# Patient Record
Sex: Female | Born: 1960 | Race: White | Hispanic: No | Smoking: Never smoker
Health system: Southern US, Community
[De-identification: ages and names within clinical notes are randomized; demographics above are authoritative.]

## PROBLEM LIST (undated history)

## (undated) DIAGNOSIS — E079 Disorder of thyroid, unspecified: Secondary | ICD-10-CM

## (undated) DIAGNOSIS — N719 Inflammatory disease of uterus, unspecified: Secondary | ICD-10-CM

## (undated) DIAGNOSIS — M1990 Unspecified osteoarthritis, unspecified site: Secondary | ICD-10-CM

## (undated) DIAGNOSIS — I10 Essential (primary) hypertension: Secondary | ICD-10-CM

## (undated) DIAGNOSIS — J45909 Unspecified asthma, uncomplicated: Secondary | ICD-10-CM

## (undated) HISTORY — DX: Unspecified osteoarthritis, unspecified site: M19.90

## (undated) HISTORY — DX: Unspecified asthma, uncomplicated: J45.909

## (undated) HISTORY — DX: Inflammatory disease of uterus, unspecified: N71.9

## (undated) HISTORY — DX: Disorder of thyroid, unspecified: E07.9

## (undated) HISTORY — DX: Essential (primary) hypertension: I10

---

## 2013-06-18 ENCOUNTER — Other Ambulatory Visit: Payer: Self-pay | Admitting: Obstetrics and Gynecology

## 2013-06-18 DIAGNOSIS — Z1231 Encounter for screening mammogram for malignant neoplasm of breast: Secondary | ICD-10-CM

## 2013-06-30 ENCOUNTER — Ambulatory Visit (INDEPENDENT_AMBULATORY_CARE_PROVIDER_SITE_OTHER): Payer: PRIVATE HEALTH INSURANCE

## 2013-06-30 DIAGNOSIS — R928 Other abnormal and inconclusive findings on diagnostic imaging of breast: Secondary | ICD-10-CM

## 2013-06-30 DIAGNOSIS — Z1231 Encounter for screening mammogram for malignant neoplasm of breast: Secondary | ICD-10-CM

## 2013-07-03 ENCOUNTER — Other Ambulatory Visit: Payer: Self-pay | Admitting: Obstetrics and Gynecology

## 2013-07-03 DIAGNOSIS — R928 Other abnormal and inconclusive findings on diagnostic imaging of breast: Secondary | ICD-10-CM

## 2013-07-23 ENCOUNTER — Other Ambulatory Visit: Payer: PRIVATE HEALTH INSURANCE

## 2013-08-05 ENCOUNTER — Ambulatory Visit
Admission: RE | Admit: 2013-08-05 | Discharge: 2013-08-05 | Disposition: A | Discharge status: Home / Self Care | Payer: PRIVATE HEALTH INSURANCE | Source: Ambulatory Visit | Attending: Obstetrics and Gynecology | Admitting: Obstetrics and Gynecology

## 2013-08-05 DIAGNOSIS — R928 Other abnormal and inconclusive findings on diagnostic imaging of breast: Secondary | ICD-10-CM

## 2014-07-27 DIAGNOSIS — Z872 Personal history of diseases of the skin and subcutaneous tissue: Secondary | ICD-10-CM | POA: Insufficient documentation

## 2017-01-02 LAB — HM PAP SMEAR: HM Pap smear: NEGATIVE

## 2017-04-11 LAB — HM MAMMOGRAPHY: HM Mammogram: NORMAL (ref 0–4)

## 2020-02-05 ENCOUNTER — Other Ambulatory Visit: Payer: Self-pay

## 2020-02-05 ENCOUNTER — Encounter: Payer: Self-pay | Admitting: Family Medicine

## 2020-02-05 ENCOUNTER — Ambulatory Visit: Payer: PRIVATE HEALTH INSURANCE | Admitting: Family Medicine

## 2020-02-05 VITALS — BP 143/79 | HR 56 | Temp 97.7°F | Ht 63.5 in | Wt 195.0 lb

## 2020-02-05 DIAGNOSIS — N952 Postmenopausal atrophic vaginitis: Secondary | ICD-10-CM | POA: Diagnosis not present

## 2020-02-05 DIAGNOSIS — J45909 Unspecified asthma, uncomplicated: Secondary | ICD-10-CM | POA: Insufficient documentation

## 2020-02-05 DIAGNOSIS — K589 Irritable bowel syndrome without diarrhea: Secondary | ICD-10-CM | POA: Insufficient documentation

## 2020-02-05 DIAGNOSIS — E039 Hypothyroidism, unspecified: Secondary | ICD-10-CM | POA: Diagnosis not present

## 2020-02-05 DIAGNOSIS — J4540 Moderate persistent asthma, uncomplicated: Secondary | ICD-10-CM | POA: Diagnosis not present

## 2020-02-05 DIAGNOSIS — I10 Essential (primary) hypertension: Secondary | ICD-10-CM

## 2020-02-05 DIAGNOSIS — Z780 Asymptomatic menopausal state: Secondary | ICD-10-CM

## 2020-02-05 DIAGNOSIS — Z0000 Encounter for general adult medical examination without abnormal findings: Secondary | ICD-10-CM

## 2020-02-05 MED ORDER — METOPROLOL TARTRATE 25 MG PO TABS: 25.0000 mg | ORAL_TABLET | Freq: Two times a day (BID) | ORAL | 1 refills | Status: DC

## 2020-02-05 MED ORDER — FLUTICASONE-SALMETEROL 250-50 MCG/DOSE IN AEPB: 1.0000 | INHALATION_SPRAY | Freq: Every day | RESPIRATORY_TRACT | 5 refills | Status: AC | PRN

## 2020-02-05 MED ORDER — ESTROGENS, CONJUGATED 0.625 MG/GM VA CREA: 1.0000 | TOPICAL_CREAM | Freq: Every day | VAGINAL | 2 refills | Status: DC

## 2020-02-05 MED ORDER — ALBUTEROL SULFATE HFA 108 (90 BASE) MCG/ACT IN AERS: 2.0000 | INHALATION_SPRAY | Freq: Four times a day (QID) | RESPIRATORY_TRACT | 5 refills | Status: AC | PRN

## 2020-02-05 NOTE — Patient Instructions (Signed)
Schedule pap smear and mammogram here.  Call [facility] to schedule colonoscopy.

## 2020-02-05 NOTE — Progress Notes (Signed)
New Patient Office Visit  Assessment & Plan:  1. Moderate persistent asthma without complication - Well controlled on current regimen.  - Fluticasone-Salmeterol (ADVAIR) 250-50 MCG/DOSE AEPB; Inhale 1 puff into the lungs daily as needed.  Dispense: 60 each; Refill: 5 - albuterol (PROAIR HFA) 108 (90 Base) MCG/ACT inhaler; Inhale 2 puffs into the lungs every 6 (six) hours as needed for wheezing or shortness of breath.  Dispense: 18 g; Refill: 5 - Cholecalciferol (VITAMIN D3) 250 MCG (10000 UT) capsule; Take 20,000 Units by mouth daily.  2. Hypothyroidism, unspecified type - TSH  3. Essential hypertension - Well controlled on current regimen.  - metoprolol tartrate (LOPRESSOR) 25 MG tablet; Take 1 tablet (25 mg total) by mouth 2 (two) times daily.  Dispense: 180 tablet; Refill: 1 - CBC with Differential/Platelet - CMP14+EGFR - Lipid panel  4-5. Vaginal atrophy/Postmenopausal - Patient agreeable to creams as long as she can afford them.  Discussed risk associated with estrogen therapy. - conjugated estrogens (PREMARIN) vaginal cream; Place 1 Applicatorful vaginally daily. x3 weeks. Take a week off and repeat.  Dispense: 60 g; Refill: 2  6.  Healthcare maintenance - Patient is going to schedule a physical with Pap smear to be completed here.  She is going to schedule to complete her mammogram on the bus here.  She is going to call [facility] in [place] to schedule her colonoscopy.   Follow-up: Return for annual physical w. pap smear.   Hendricks Limes, MSN, APRN, FNP-C [facility]  Subjective:  Patient ID: Hayley Murillo, female    DOB: 05/12/1961  Age: 59 y.o. MRN: [id number]  Patient Care Team: Loman Brooklyn, FNP as PCP - General (Family Medicine)  CC:  Chief Complaint  Patient presents with  . New Patient (Initial Visit)    Suzanna Obey, MD  . Establish Care    HPI Hayley Murillo presents to establish care.   Asthma: Patient reports she  rarely has to use albuterol.  She only uses the Advair seasonally.  She does take vitamin D3 20,000 units to help control her asthma and has been doing this since she read a research study proving its benefits.  She reports a history of hypothyroidism, but does not take medication for this.  Also has a history of IBS for which she does not take medication.  Patient is postmenopausal and experiences dyspareunia.  She reports she was prescribed creams in the past but they were expensive so she could not try them.   Review of Systems  Constitutional: Negative for chills, fever, malaise/fatigue and weight loss.  HENT: Negative for congestion, ear discharge, ear pain, nosebleeds, sinus pain, sore throat and tinnitus.   Eyes: Negative for blurred vision, double vision, pain, discharge and redness.  Respiratory: Negative for cough, shortness of breath and wheezing.   Cardiovascular: Negative for chest pain, palpitations and leg swelling.  Gastrointestinal: Negative for abdominal pain, constipation, diarrhea, heartburn, nausea and vomiting.  Genitourinary: Negative for dysuria, frequency and urgency.  Musculoskeletal: Negative for myalgias.  Skin: Negative for rash.  Neurological: Negative for dizziness, seizures, weakness and headaches.  Psychiatric/Behavioral: Negative for depression, substance abuse and suicidal ideas. The patient is not nervous/anxious.     Current Outpatient Medications:  .  albuterol (PROAIR HFA) 108 (90 Base) MCG/ACT inhaler, Inhale 2 puffs into the lungs every 6 (six) hours as needed for wheezing or shortness of breath., Disp: , Rfl:  .  Fluticasone-Salmeterol (ADVAIR) 250-50 MCG/DOSE AEPB, Inhale 1 puff into  the lungs daily as needed., Disp: , Rfl:  .  metoprolol tartrate (LOPRESSOR) 25 MG tablet, Take 25 mg by mouth 2 (two) times daily., Disp: , Rfl:   Allergies  Allergen Reactions  . Latex Itching    Cough, itching, sneezing   . Morphine And Related Itching  .  Dust Mite Extract Itching  . Lactose Nausea And Vomiting    Pt states it causes her to have IBS occassionally but not all the time. She still consumes it.   . Other Itching    Pt states she sneezes, has eye itching, and respiratory issues with dust, mold, and pollen. "nothing life threatening" per pt.   . Wheat Bran Itching    Pt still consumes wheat but just notes some intolerance  . Lisinopril Cough    significant    Past Medical History:  Diagnosis Date  . Arthritis   . Asthma   . Endometritis   . Hypertension   . Thyroid disease     Past Surgical History:  Procedure Laterality Date  . CESAREAN SECTION  1984  . CESAREAN SECTION  1991    Family History  Problem Relation Age of Onset  . Colon cancer Father   . Asthma Sister   . Asthma Daughter   . Colon cancer Paternal Aunt   . Hypertension Paternal Grandmother   . Colon cancer Paternal Grandfather   . Bone cancer Paternal Grandfather     Social History   Socioeconomic History  . Marital status: Widowed    Spouse name: Not on file  . Number of children: Not on file  . Years of education: Not on file  . Highest education level: Not on file  Occupational History  . Not on file  Tobacco Use  . Smoking status: Never Smoker  . Smokeless tobacco: Never Used  Vaping Use  . Vaping Use: Never used  Substance and Sexual Activity  . Alcohol use: Yes    Comment: occ  . Drug use: Never  . Sexual activity: Yes    Birth control/protection: None  Other Topics Concern  . Not on file  Social History Narrative  . Not on file   Social Determinants of Health   Financial Resource Strain:   . Difficulty of Paying Living Expenses:   Food Insecurity:   . Worried About Charity fundraiser in the Last Year:   . Arboriculturist in the Last Year:   Transportation Needs:   . Film/video editor (Medical):   Marland Kitchen Lack of Transportation (Non-Medical):   Physical Activity:   . Days of Exercise per Week:   . Minutes of  Exercise per Session:   Stress:   . Feeling of Stress :   Social Connections:   . Frequency of Communication with Friends and Family:   . Frequency of Social Gatherings with Friends and Family:   . Attends Religious Services:   . Active Member of Clubs or Organizations:   . Attends Archivist Meetings:   Marland Kitchen Marital Status:   Intimate Partner Violence:   . Fear of Current or Ex-Partner:   . Emotionally Abused:   Marland Kitchen Physically Abused:   . Sexually Abused:     Objective:   Today's Vitals: BP (!) 143/79   Pulse (!) 56   Temp 97.7 F (36.5 C) (Temporal)   Ht 5' 3.5" (1.613 m)   Wt 195 lb (88.5 kg)   SpO2 97%   BMI 34.00 kg/m  Physical Exam Vitals reviewed.  Constitutional:      General: She is not in acute distress.    Appearance: Normal appearance. She is obese. She is not ill-appearing, toxic-appearing or diaphoretic.  HENT:     Head: Normocephalic and atraumatic.  Eyes:     General: No scleral icterus.       Right eye: No discharge.        Left eye: No discharge.     Conjunctiva/sclera: Conjunctivae normal.  Cardiovascular:     Rate and Rhythm: Normal rate and regular rhythm.     Heart sounds: Normal heart sounds. No murmur heard.  No friction rub. No gallop.   Pulmonary:     Effort: Pulmonary effort is normal. No respiratory distress.     Breath sounds: Normal breath sounds. No stridor. No wheezing, rhonchi or rales.  Musculoskeletal:        General: Normal range of motion.     Cervical back: Normal range of motion.  Skin:    General: Skin is warm and dry.     Capillary Refill: Capillary refill takes less than 2 seconds.  Neurological:     General: No focal deficit present.     Mental Status: She is alert and oriented to person, place, and time. Mental status is at baseline.  Psychiatric:        Mood and Affect: Mood normal.        Behavior: Behavior normal.        Thought Content: Thought content normal.        Judgment: Judgment normal.

## 2020-02-06 LAB — CBC WITH DIFFERENTIAL/PLATELET
Basophils Absolute: 0.1 10*3/uL (ref 0.0–0.2)
Basos: 1 %
EOS (ABSOLUTE): 0.5 10*3/uL — ABNORMAL HIGH (ref 0.0–0.4)
Eos: 4 %
Hematocrit: 42.3 % (ref 34.0–46.6)
Hemoglobin: 14.4 g/dL (ref 11.1–15.9)
Immature Grans (Abs): 0 10*3/uL (ref 0.0–0.1)
Immature Granulocytes: 0 %
Lymphocytes Absolute: 6.1 10*3/uL — ABNORMAL HIGH (ref 0.7–3.1)
Lymphs: 49 %
MCH: 30 pg (ref 26.6–33.0)
MCHC: 34 g/dL (ref 31.5–35.7)
MCV: 88 fL (ref 79–97)
Monocytes Absolute: 1.2 10*3/uL — ABNORMAL HIGH (ref 0.1–0.9)
Monocytes: 9 %
Neutrophils Absolute: 4.7 10*3/uL (ref 1.4–7.0)
Neutrophils: 37 %
Platelets: 305 10*3/uL (ref 150–450)
RBC: 4.8 x10E6/uL (ref 3.77–5.28)
RDW: 14.3 % (ref 11.7–15.4)
WBC: 12.5 10*3/uL — ABNORMAL HIGH (ref 3.4–10.8)

## 2020-02-06 LAB — TSH: TSH: 8.31 u[IU]/mL — ABNORMAL HIGH (ref 0.450–4.500)

## 2020-02-06 LAB — CMP14+EGFR
ALT: 23 IU/L (ref 0–32)
AST: 24 IU/L (ref 0–40)
Albumin/Globulin Ratio: 1.8 (ref 1.2–2.2)
Albumin: 4.6 g/dL (ref 3.8–4.9)
Alkaline Phosphatase: 137 IU/L — ABNORMAL HIGH (ref 48–121)
BUN/Creatinine Ratio: 21 (ref 9–23)
BUN: 20 mg/dL (ref 6–24)
Bilirubin Total: <0.2 mg/dL (ref 0.0–1.2)
CO2: 22 mmol/L (ref 20–29)
Calcium: 9.6 mg/dL (ref 8.7–10.2)
Chloride: 104 mmol/L (ref 96–106)
Creatinine, Ser: 0.94 mg/dL (ref 0.57–1.00)
GFR calc Af Amer: 77 mL/min/{1.73_m2} (ref >59)
GFR calc non Af Amer: 67 mL/min/{1.73_m2} (ref >59)
Globulin, Total: 2.5 g/dL (ref 1.5–4.5)
Glucose: 86 mg/dL (ref 65–99)
Potassium: 4.2 mmol/L (ref 3.5–5.2)
Sodium: 142 mmol/L (ref 134–144)
Total Protein: 7.1 g/dL (ref 6.0–8.5)

## 2020-02-06 LAB — LIPID PANEL
Chol/HDL Ratio: 4.2 ratio (ref 0.0–4.4)
Cholesterol, Total: 245 mg/dL — ABNORMAL HIGH (ref 100–199)
HDL: 59 mg/dL (ref >39)
LDL Chol Calc (NIH): 153 mg/dL — ABNORMAL HIGH (ref 0–99)
Triglycerides: 184 mg/dL — ABNORMAL HIGH (ref 0–149)
VLDL Cholesterol Cal: 33 mg/dL (ref 5–40)

## 2020-02-07 DIAGNOSIS — I10 Essential (primary) hypertension: Secondary | ICD-10-CM | POA: Insufficient documentation

## 2020-02-07 DIAGNOSIS — Z780 Asymptomatic menopausal state: Secondary | ICD-10-CM | POA: Insufficient documentation

## 2020-02-07 DIAGNOSIS — N952 Postmenopausal atrophic vaginitis: Secondary | ICD-10-CM | POA: Insufficient documentation

## 2020-02-08 ENCOUNTER — Encounter: Payer: Self-pay | Admitting: Family Medicine

## 2020-02-08 DIAGNOSIS — E782 Mixed hyperlipidemia: Secondary | ICD-10-CM | POA: Insufficient documentation

## 2020-02-08 HISTORY — DX: Mixed hyperlipidemia: E78.2

## 2020-02-09 NOTE — Progress Notes (Signed)
done

## 2020-02-18 ENCOUNTER — Other Ambulatory Visit: Payer: Self-pay | Admitting: Family Medicine

## 2020-02-18 ENCOUNTER — Other Ambulatory Visit: Payer: Self-pay | Admitting: *Deleted

## 2020-02-18 ENCOUNTER — Telehealth: Payer: Self-pay | Admitting: Family Medicine

## 2020-02-19 ENCOUNTER — Other Ambulatory Visit: Payer: Self-pay | Admitting: Family Medicine

## 2020-02-19 DIAGNOSIS — Z1231 Encounter for screening mammogram for malignant neoplasm of breast: Secondary | ICD-10-CM

## 2020-03-03 ENCOUNTER — Other Ambulatory Visit: Payer: Self-pay

## 2020-03-18 ENCOUNTER — Encounter: Payer: PRIVATE HEALTH INSURANCE | Admitting: Family Medicine

## 2020-03-30 ENCOUNTER — Other Ambulatory Visit: Payer: Self-pay

## 2020-03-30 ENCOUNTER — Ambulatory Visit
Admission: RE | Admit: 2020-03-30 | Discharge: 2020-03-30 | Disposition: A | Discharge status: Home / Self Care | Payer: PRIVATE HEALTH INSURANCE | Source: Ambulatory Visit | Attending: Family Medicine | Admitting: Family Medicine

## 2020-03-30 DIAGNOSIS — Z1231 Encounter for screening mammogram for malignant neoplasm of breast: Secondary | ICD-10-CM

## 2020-04-15 ENCOUNTER — Encounter: Payer: PRIVATE HEALTH INSURANCE | Admitting: Family Medicine

## 2020-04-22 ENCOUNTER — Ambulatory Visit (INDEPENDENT_AMBULATORY_CARE_PROVIDER_SITE_OTHER): Payer: PRIVATE HEALTH INSURANCE | Admitting: Family Medicine

## 2020-04-22 ENCOUNTER — Other Ambulatory Visit: Payer: Self-pay

## 2020-04-22 ENCOUNTER — Other Ambulatory Visit (HOSPITAL_COMMUNITY)
Admission: RE | Admit: 2020-04-22 | Discharge: 2020-04-22 | Disposition: A | Discharge status: Home / Self Care | Payer: PRIVATE HEALTH INSURANCE | Source: Ambulatory Visit | Attending: Family Medicine | Admitting: Family Medicine

## 2020-04-22 ENCOUNTER — Encounter: Payer: Self-pay | Admitting: Family Medicine

## 2020-04-22 VITALS — BP 168/82 | HR 55 | Temp 97.8°F | Resp 20 | Ht 63.0 in | Wt 191.0 lb

## 2020-04-22 DIAGNOSIS — Z124 Encounter for screening for malignant neoplasm of cervix: Secondary | ICD-10-CM | POA: Diagnosis not present

## 2020-04-22 NOTE — Patient Instructions (Signed)

## 2020-04-22 NOTE — Progress Notes (Signed)
Subjective: CC: pap smear PCP: Loman Brooklyn, FNP  Hayley Murillo is a 59 y.o. female presenting to clinic today for:  1. Cervical cancer screening Hayley Murillo is here for a pap smear. Her last pap smear was in 2018 and was normal. She reports vaginal dryness for which she had tried Premarin for, but decided to stop due to the cost. She is not currently interested in other treatments. She denies vaginal discharge, bleeding, itching, or pain. She does want a physical today and would just like to have a pap done.   Relevant past medical, surgical, family, and social history reviewed and updated as indicated.  Allergies and medications reviewed and updated.  Allergies  Allergen Reactions  . Latex Itching    Cough, itching, sneezing   . Morphine And Related Itching  . Dust Mite Extract Itching  . Lactose Nausea And Vomiting    Pt states it causes her to have IBS occassionally but not all the time. She still consumes it.   . Other Itching    Pt states she sneezes, has eye itching, and respiratory issues with dust, mold, and pollen. "nothing life threatening" per pt.   . Wheat Bran Itching    Pt still consumes wheat but just notes some intolerance  . Lisinopril Cough    significant   Past Medical History:  Diagnosis Date  . Arthritis   . Asthma   . Endometritis   . Hypertension   . Mixed hyperlipidemia 02/08/2020  . Thyroid disease     Current Outpatient Medications:  .  albuterol (PROAIR HFA) 108 (90 Base) MCG/ACT inhaler, Inhale 2 puffs into the lungs every 6 (six) hours as needed for wheezing or shortness of breath., Disp: 18 g, Rfl: 5 .  Cholecalciferol (VITAMIN D3) 250 MCG (10000 UT) capsule, Take 20,000 Units by mouth daily., Disp: , Rfl:  .  Fluticasone-Salmeterol (ADVAIR) 250-50 MCG/DOSE AEPB, Inhale 1 puff into the lungs daily as needed., Disp: 60 each, Rfl: 5 .  metoprolol tartrate (LOPRESSOR) 25 MG tablet, Take 1 tablet (25 mg total) by mouth 2 (two) times daily.,  Disp: 180 tablet, Rfl: 1 Social History   Socioeconomic History  . Marital status: Widowed    Spouse name: Not on file  . Number of children: Not on file  . Years of education: Not on file  . Highest education level: Not on file  Occupational History  . Not on file  Tobacco Use  . Smoking status: Never Smoker  . Smokeless tobacco: Never Used  Vaping Use  . Vaping Use: Never used  Substance and Sexual Activity  . Alcohol use: Yes    Comment: occ  . Drug use: Never  . Sexual activity: Yes    Birth control/protection: None  Other Topics Concern  . Not on file  Social History Narrative  . Not on file   Social Determinants of Health   Financial Resource Strain:   . Difficulty of Paying Living Expenses: Not on file  Food Insecurity:   . Worried About Charity fundraiser in the Last Year: Not on file  . Ran Out of Food in the Last Year: Not on file  Transportation Needs:   . Lack of Transportation (Medical): Not on file  . Lack of Transportation (Non-Medical): Not on file  Physical Activity:   . Days of Exercise per Week: Not on file  . Minutes of Exercise per Session: Not on file  Stress:   . Feeling of Stress :  Not on file  Social Connections:   . Frequency of Communication with Friends and Family: Not on file  . Frequency of Social Gatherings with Friends and Family: Not on file  . Attends Religious Services: Not on file  . Active Member of Clubs or Organizations: Not on file  . Attends Archivist Meetings: Not on file  . Marital Status: Not on file  Intimate Partner Violence:   . Fear of Current or Ex-Partner: Not on file  . Emotionally Abused: Not on file  . Physically Abused: Not on file  . Sexually Abused: Not on file   Family History  Problem Relation Age of Onset  . Colon cancer Father   . Asthma Sister   . Asthma Daughter   . Colon cancer Paternal Aunt   . Hypertension Paternal Grandmother   . Colon cancer Paternal Grandfather   . Bone  cancer Paternal Grandfather     Review of Systems  Constitutional: Negative for chills, fever and unexpected weight change.  Genitourinary: Negative for decreased urine volume, difficulty urinating, genital sores, pelvic pain, urgency, vaginal bleeding, vaginal discharge and vaginal pain.     Objective: Office vital signs reviewed. BP (!) 168/82   Pulse (!) 55   Temp 97.8 F (36.6 C) (Temporal)   Resp 20   Ht 5' 3"  (1.6 m)   Wt 191 lb (86.6 kg)   SpO2 99%   BMI 33.83 kg/m   Physical Examination:  Physical Exam Vitals and nursing note reviewed. Exam conducted with a chaperone present.  Constitutional:      General: She is not in acute distress.    Appearance: Normal appearance. She is not ill-appearing.  Cardiovascular:     Rate and Rhythm: Normal rate and regular rhythm.     Pulses: Normal pulses.     Heart sounds: Normal heart sounds.  Pulmonary:     Effort: Pulmonary effort is normal.     Breath sounds: Normal breath sounds.  Abdominal:     Palpations: Abdomen is soft.  Genitourinary:    General: Normal vulva.     Pubic Area: No rash or pubic lice.      Labia:        Right: No rash, tenderness, lesion or injury.        Left: No rash, tenderness, lesion or injury.      Vagina: Normal.     Cervix: Normal.     Uterus: Normal.      Adnexa: Right adnexa normal and left adnexa normal.  Neurological:     Mental Status: She is alert and oriented to person, place, and time.  Psychiatric:        Mood and Affect: Mood normal.        Behavior: Behavior normal.      Results for orders placed or performed in visit on 02/05/20  HM MAMMOGRAPHY  Result Value Ref Range   HM Mammogram Self Reported Normal 0-4 Bi-Rad, Self Reported Normal  CBC with Differential/Platelet  Result Value Ref Range   WBC 12.5 (H) 3.4 - 10.8 x10E3/uL   RBC 4.80 3.77 - 5.28 x10E6/uL   Hemoglobin 14.4 11.1 - 15.9 g/dL   Hematocrit 42.3 34.0 - 46.6 %   MCV 88 79 - 97 fL   MCH 30.0 26.6 - 33.0 pg    MCHC 34.0 31 - 35 g/dL   RDW 14.3 11.7 - 15.4 %   Platelets 305 150 - 450 x10E3/uL   Neutrophils 37 Not Estab. %  Lymphs 49 Not Estab. %   Monocytes 9 Not Estab. %   Eos 4 Not Estab. %   Basos 1 Not Estab. %   Neutrophils Absolute 4.7 1 - 7 x10E3/uL   Lymphocytes Absolute 6.1 (H) 0 - 3 x10E3/uL   Monocytes Absolute 1.2 (H) 0 - 0 x10E3/uL   EOS (ABSOLUTE) 0.5 (H) 0.0 - 0.4 x10E3/uL   Basophils Absolute 0.1 0 - 0 x10E3/uL   Immature Granulocytes 0 Not Estab. %   Immature Grans (Abs) 0.0 0.0 - 0.1 x10E3/uL  CMP14+EGFR  Result Value Ref Range   Glucose 86 65 - 99 mg/dL   BUN 20 6 - 24 mg/dL   Creatinine, Ser 0.94 0.57 - 1.00 mg/dL   GFR calc non Af Amer 67 >59 mL/min/1.73   GFR calc Af Amer 77 >59 mL/min/1.73   BUN/Creatinine Ratio 21 9 - 23   Sodium 142 134 - 144 mmol/L   Potassium 4.2 3.5 - 5.2 mmol/L   Chloride 104 96 - 106 mmol/L   CO2 22 20 - 29 mmol/L   Calcium 9.6 8.7 - 10.2 mg/dL   Total Protein 7.1 6.0 - 8.5 g/dL   Albumin 4.6 3.8 - 4.9 g/dL   Globulin, Total 2.5 1.5 - 4.5 g/dL   Albumin/Globulin Ratio 1.8 1.2 - 2.2   Bilirubin Total <0.2 0.0 - 1.2 mg/dL   Alkaline Phosphatase 137 (H) 48 - 121 IU/L   AST 24 0 - 40 IU/L   ALT 23 0 - 32 IU/L  Lipid panel  Result Value Ref Range   Cholesterol, Total 245 (H) 100 - 199 mg/dL   Triglycerides 184 (H) 0 - 149 mg/dL   HDL 59 >39 mg/dL   VLDL Cholesterol Cal 33 5 - 40 mg/dL   LDL Chol Calc (NIH) 153 (H) 0 - 99 mg/dL   Chol/HDL Ratio 4.2 0.0 - 4.4 ratio  TSH  Result Value Ref Range   TSH 8.310 (H) 0.450 - 4.500 uIU/mL  HM PAP SMEAR  Result Value Ref Range   HM Pap smear neg      Assessment/ Plan: 59 y.o. female   1. Pap smear for cervical cancer screening Will notify if results are abnormal. Return for new or worsening symptoms. - Cytology - PAP   Return if symptoms worsen or fail to improve.  The above assessment and management plan was discussed with the patient. The patient verbalized understanding of  and has agreed to the management plan. Patient is aware to call the clinic if symptoms persist or worsen. Patient is aware when to return to the clinic for a follow-up visit. Patient educated on when it is appropriate to go to the emergency department.   Marjorie Smolder, FNP-C [facility] [address] [place], [place] [address] [phone]

## 2020-04-26 LAB — CYTOLOGY - PAP: Diagnosis: NEGATIVE

## 2020-09-20 ENCOUNTER — Other Ambulatory Visit: Payer: Self-pay | Admitting: Family Medicine

## 2020-09-20 DIAGNOSIS — I10 Essential (primary) hypertension: Secondary | ICD-10-CM

## 2021-11-01 IMAGING — MG DIGITAL SCREENING BILAT W/ TOMO W/ CAD
8 series · 9 of 24 positions shown · non-contrast
Comparison: Previous exam(s).

ACR Breast Density Category a: The breast tissue is almost entirely
fatty.

CLINICAL DATA: Screening.

EXAM:
DIGITAL SCREENING BILATERAL MAMMOGRAM WITH TOMO AND CAD

[L MLO synth-2D]
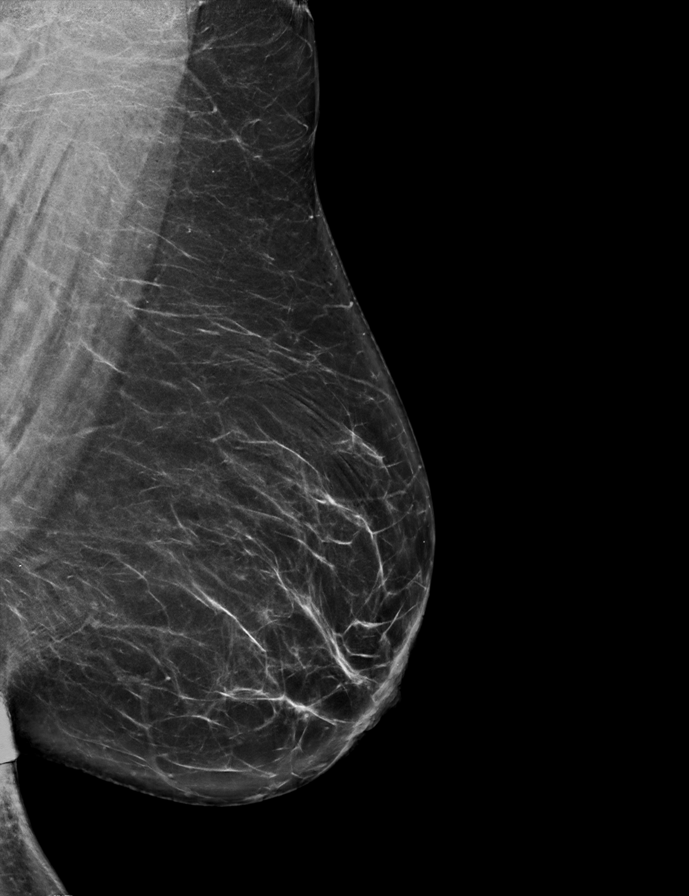

[R CC synth-2D]
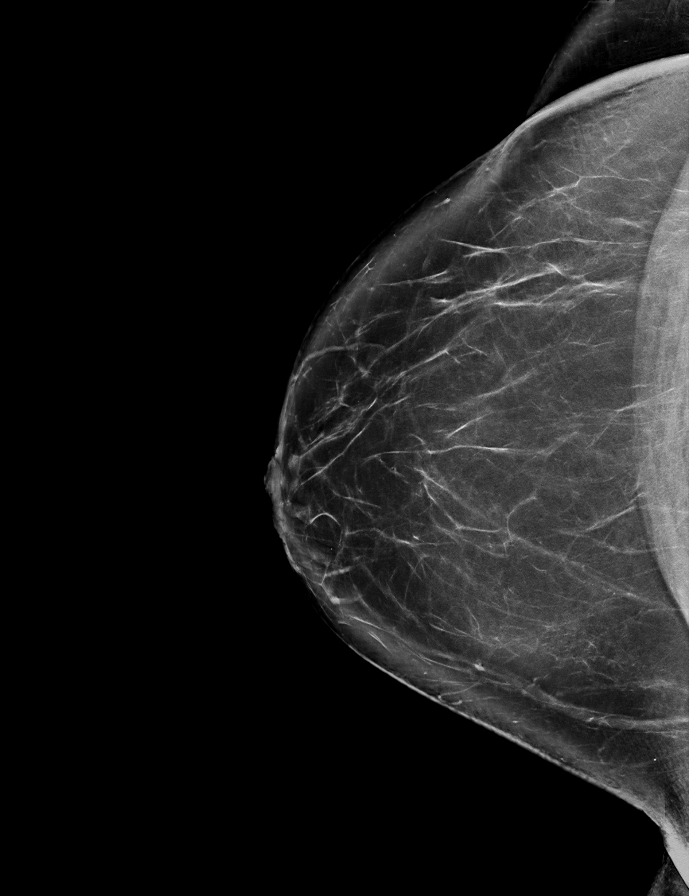

[R MLO synth-2D]
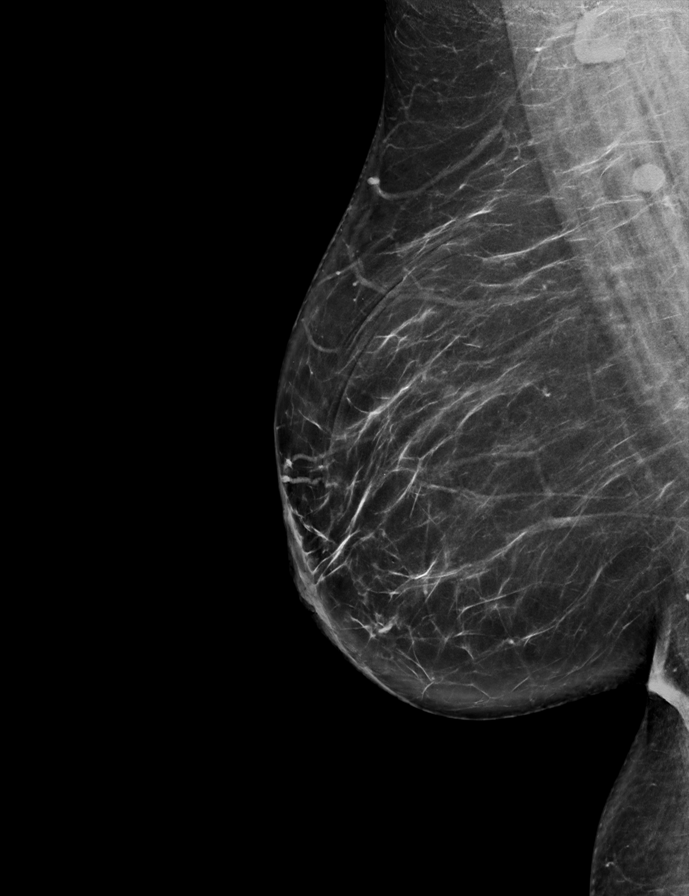

[L CC synth-2D]
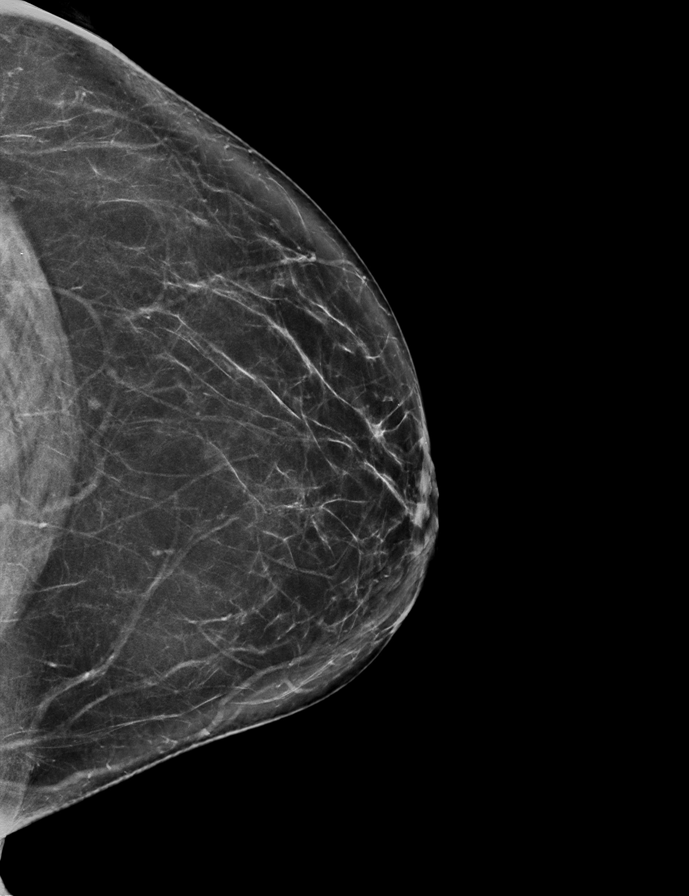

[L MLO tomo · 2 of 76 frames shown]
[frame 25/76]
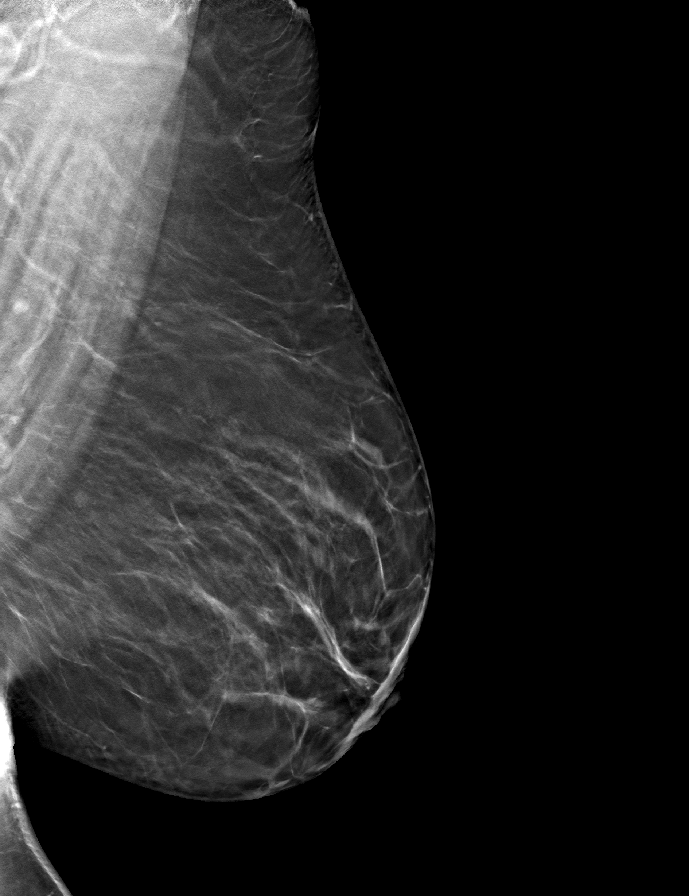
[frame 39/76]
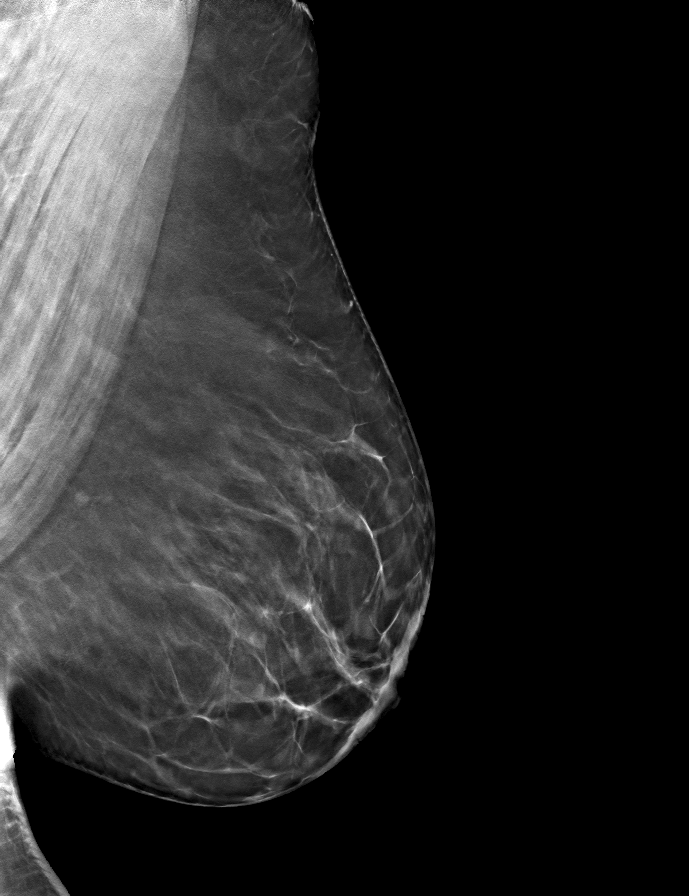

[R MLO tomo · tomo slice 38/75.0]
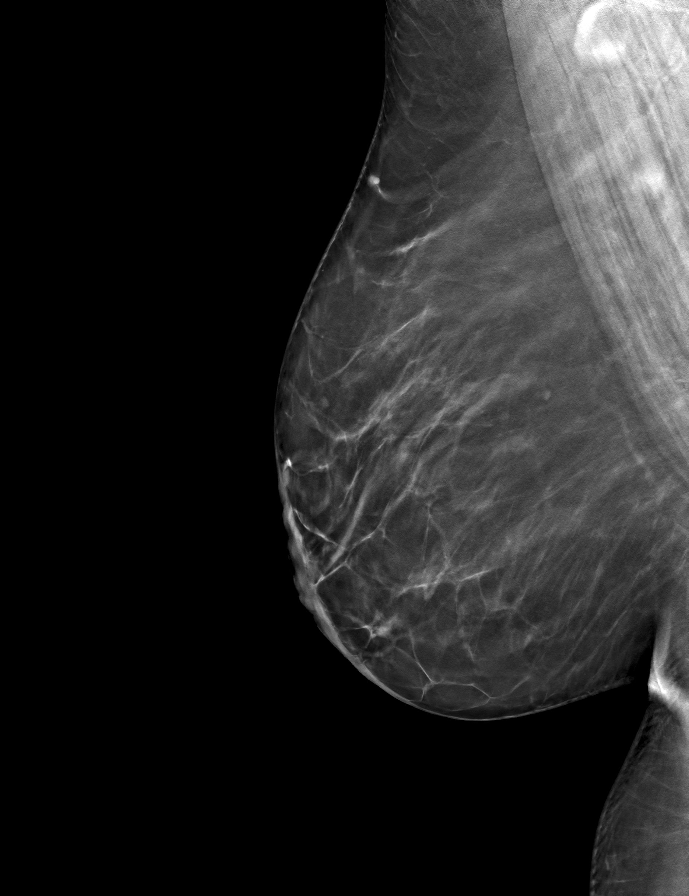

[R CC tomo · tomo slice 41/81.0]
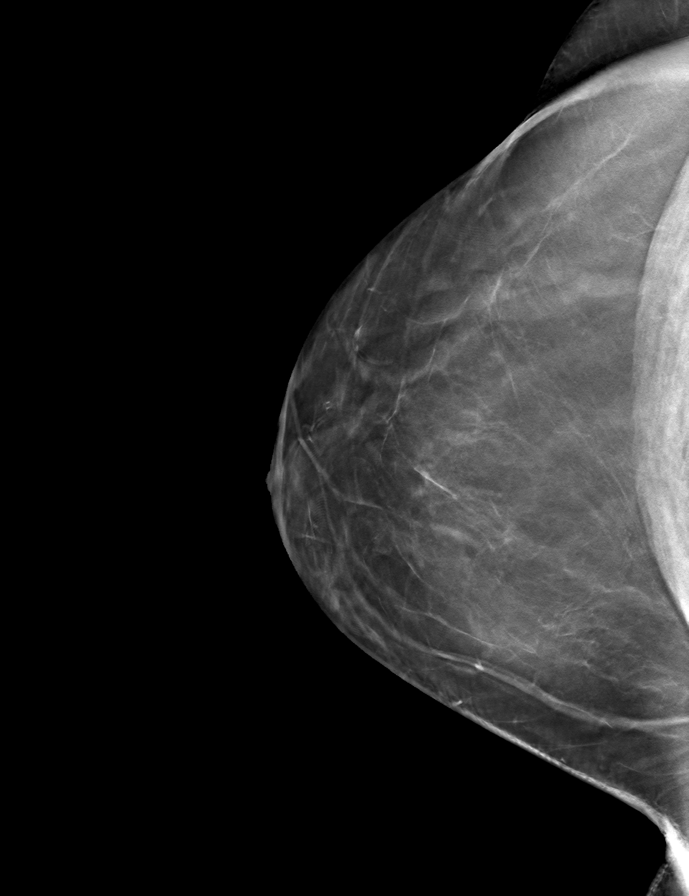

[L CC tomo · tomo slice 37/73.0]
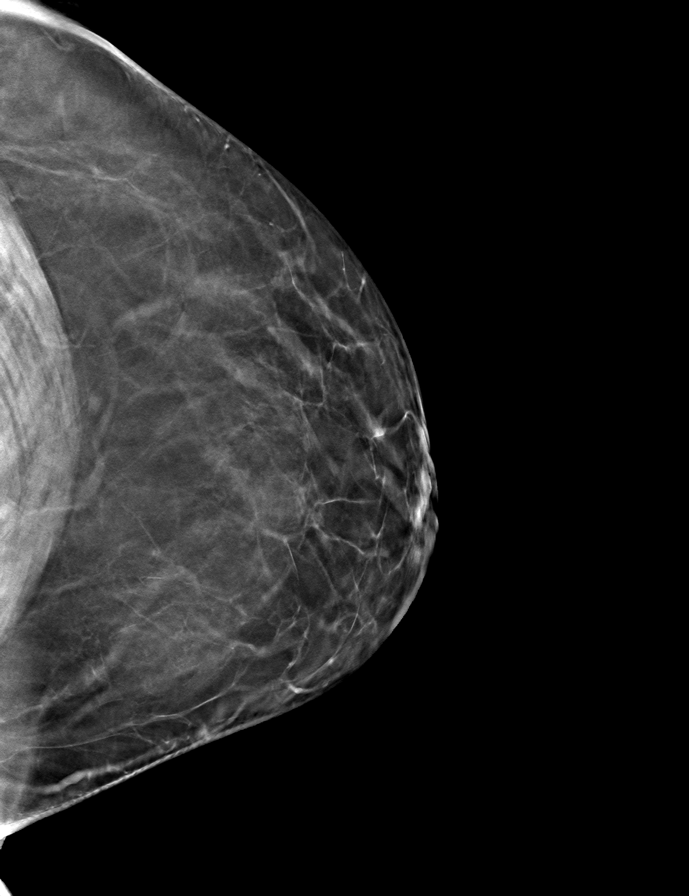

[9 of 24 positions shown; findings below may reference images not displayed]

FINDINGS: There are no findings suspicious for malignancy. Images were
processed with CAD.
IMPRESSION: No mammographic evidence of malignancy. A result letter of this
screening mammogram will be mailed directly to the patient.

RECOMMENDATION:
Screening mammogram in one year. (Code:8Y-Q-VVS)

BI-RADS CATEGORY  1: Negative.
